# Patient Record
Sex: Male | Born: 1935 | Race: White | Hispanic: No | Marital: Married | Smoking: Never smoker
Health system: Southern US, Community
[De-identification: ages and names within clinical notes are randomized; demographics above are authoritative.]

## PROBLEM LIST (undated history)

## (undated) DIAGNOSIS — I2510 Atherosclerotic heart disease of native coronary artery without angina pectoris: Secondary | ICD-10-CM

## (undated) DIAGNOSIS — I10 Essential (primary) hypertension: Secondary | ICD-10-CM

## (undated) DIAGNOSIS — M1990 Unspecified osteoarthritis, unspecified site: Secondary | ICD-10-CM

## (undated) DIAGNOSIS — E119 Type 2 diabetes mellitus without complications: Secondary | ICD-10-CM

## (undated) HISTORY — PX: ANKLE SURGERY: SHX546

## (undated) HISTORY — PX: TONSILLECTOMY: SUR1361

## (undated) HISTORY — PX: CHOLECYSTECTOMY: SHX55

## (undated) HISTORY — PX: APPENDECTOMY: SHX54

## (undated) HISTORY — PX: HIP SURGERY: SHX245

## (undated) HISTORY — PX: PACEMAKER IMPLANT: EP1218

## (undated) HISTORY — PX: FEMUR SURGERY: SHX943

---

## 2017-09-06 ENCOUNTER — Emergency Department (HOSPITAL_BASED_OUTPATIENT_CLINIC_OR_DEPARTMENT_OTHER)
Admission: EM | Admit: 2017-09-06 | Discharge: 2017-09-06 | Disposition: A | Discharge status: Home / Self Care | Payer: Medicare Other | Attending: Physician Assistant | Admitting: Physician Assistant

## 2017-09-06 ENCOUNTER — Emergency Department (HOSPITAL_BASED_OUTPATIENT_CLINIC_OR_DEPARTMENT_OTHER): Payer: Medicare Other

## 2017-09-06 ENCOUNTER — Other Ambulatory Visit: Payer: Self-pay

## 2017-09-06 ENCOUNTER — Encounter (HOSPITAL_BASED_OUTPATIENT_CLINIC_OR_DEPARTMENT_OTHER): Payer: Self-pay | Admitting: *Deleted

## 2017-09-06 DIAGNOSIS — W06XXXA Fall from bed, initial encounter: Secondary | ICD-10-CM | POA: Diagnosis not present

## 2017-09-06 DIAGNOSIS — S0990XA Unspecified injury of head, initial encounter: Secondary | ICD-10-CM

## 2017-09-06 DIAGNOSIS — I10 Essential (primary) hypertension: Secondary | ICD-10-CM | POA: Diagnosis not present

## 2017-09-06 DIAGNOSIS — Y939 Activity, unspecified: Secondary | ICD-10-CM | POA: Insufficient documentation

## 2017-09-06 DIAGNOSIS — E119 Type 2 diabetes mellitus without complications: Secondary | ICD-10-CM | POA: Insufficient documentation

## 2017-09-06 DIAGNOSIS — Y999 Unspecified external cause status: Secondary | ICD-10-CM | POA: Insufficient documentation

## 2017-09-06 DIAGNOSIS — Y929 Unspecified place or not applicable: Secondary | ICD-10-CM | POA: Diagnosis not present

## 2017-09-06 DIAGNOSIS — S098XXA Other specified injuries of head, initial encounter: Secondary | ICD-10-CM | POA: Insufficient documentation

## 2017-09-06 DIAGNOSIS — Z950 Presence of cardiac pacemaker: Secondary | ICD-10-CM | POA: Insufficient documentation

## 2017-09-06 DIAGNOSIS — I2510 Atherosclerotic heart disease of native coronary artery without angina pectoris: Secondary | ICD-10-CM | POA: Diagnosis not present

## 2017-09-06 HISTORY — DX: Essential (primary) hypertension: I10

## 2017-09-06 HISTORY — DX: Type 2 diabetes mellitus without complications: E11.9

## 2017-09-06 HISTORY — DX: Atherosclerotic heart disease of native coronary artery without angina pectoris: I25.10

## 2017-09-06 HISTORY — DX: Unspecified osteoarthritis, unspecified site: M19.90

## 2017-09-06 NOTE — Discharge Instructions (Addendum)
Please return with any concerns.  We are happy to report that your CAT scan is normal.  No evidence of bleeding intracranially.

## 2017-09-06 NOTE — ED Provider Notes (Signed)
[facility] EMERGENCY DEPARTMENT Provider Note   CSN: [id number] Arrival date & time: 09/06/17  1939     History   Chief Complaint Chief Complaint  Patient presents with  . Fall    HPI Frank Ibarra is a 84 y.o. male.  HPI  84 year old male on blood thinners presenting with mechanical fall down on his head.  Patient reports that he fell out of the bed and hit his head.  Patient's daughter accompanies patient and reports that he had one other fall last week as well.  Patient has no altered mental status no complaints currently.  No pain anywhere.  Past Medical History:  Diagnosis Date  . Arthritis   . Coronary artery disease   . Diabetes mellitus without complication (HCC)   . Hypertension     There are no active problems to display for this patient.   Past Surgical History:  Procedure Laterality Date  . ANKLE SURGERY    . APPENDECTOMY    . CHOLECYSTECTOMY    . FEMUR SURGERY    . HIP SURGERY    . PACEMAKER IMPLANT    . TONSILLECTOMY          Home Medications    Prior to Admission medications   Not on File    Family History No family history on file.  Social History Social History   Tobacco Use  . Smoking status: Never Smoker  . Smokeless tobacco: Never Used  Substance Use Topics  . Alcohol use: Never    Frequency: Never  . Drug use: Never     Allergies   Patient has no known allergies.   Review of Systems Review of Systems  Constitutional: Negative for activity change.  Respiratory: Negative for shortness of breath.   Cardiovascular: Negative for chest pain.  Gastrointestinal: Negative for abdominal pain.  All other systems reviewed and are negative.    Physical Exam Updated Vital Signs BP 130/80   Pulse 88   Temp 99 F (37.2 C) (Oral)   Resp 20   Ht 6' 2.5" (1.892 m)   Wt 78 kg (172 lb)   SpO2 98%   BMI 21.79 kg/m   Physical Exam  Constitutional: He is oriented to person, place, and time. He appears  well-nourished.  HENT:  Head: Normocephalic.  Eyes: Conjunctivae are normal.  Cardiovascular: Normal rate and regular rhythm.  Pulmonary/Chest: Effort normal and breath sounds normal.  Abdominal: Soft. He exhibits no distension. There is no tenderness.  Musculoskeletal:  Small old bruising to right forearm, left cheek.  Moving all extremities.  Neurological: He is oriented to person, place, and time.  Skin: Skin is warm and dry. He is not diaphoretic.  Psychiatric: He has a normal mood and affect. His behavior is normal.     ED Treatments / Results  Labs (all labs ordered are listed, but only abnormal results are displayed) Labs Reviewed - No data to display  EKG None  Radiology Ct Head Wo Contrast  Result Date: 09/06/2017 CLINICAL DATA:  84 year old male sitting in chair and fell hitting left side of head on wooden table. No loss of consciousness. Initial encounter. EXAM: CT HEAD WITHOUT CONTRAST TECHNIQUE: Contiguous axial images were obtained from the base of the skull through the vertex without intravenous contrast. COMPARISON:  None. FINDINGS: Brain: No intracranial hemorrhage or CT evidence of large acute infarct. Chronic microvascular changes. Global atrophy. No intracranial mass lesion noted on this unenhanced exam. Vascular: Vascular calcifications Skull: No skull fracture Sinuses/Orbits:  No acute orbital abnormality. Mild mucosal thickening ethmoid sinus air cells greater on the right. Other: Mastoid air cells and middle ear cavities are clear. IMPRESSION: No skull fracture or intracranial hemorrhage. Chronic microvascular changes. Global atrophy. Electronically Signed   By: Lacy DuverneySteven  Olson M.D.   On: 09/06/2017 20:31    Procedures Procedures (including critical care time)  Medications Ordered in ED Medications - No data to display   Initial Impression / Assessment and Plan / ED Course  I have reviewed the triage vital signs and the nursing notes.  Pertinent labs &  imaging results that were available during my care of the patient were reviewed by me and considered in my medical decision making (see chart for details).    84 year old male on blood thinners presenting with mechanical fall down on his head.  Patient reports that he fell out of the bed and hit his head.  Patient's daughter accompanies patient and reports that he had one other fall last week as well.  Patient has no altered mental status no complaints currently.  No pain anywhere.  8:56 PM Did CT of her head.  Offered further work-up including labs and urine but patient's family is happy to have him return home.  No abrasions requiring tetanus   Final Clinical Impressions(s) / ED Diagnoses   Final diagnoses:  None    ED Discharge Orders    None       Abelino DerrickMackuen, Ryder Man Lyn, MD 09/06/17 2057

## 2017-09-06 NOTE — ED Triage Notes (Signed)
He was sitting in a chair and while reaching to get something from a table the chair tipped over. He hit the left side of his head on the wooden table. No LOC. He does take blood thinners. He has a small hematoma to the back of his head.

## 2017-09-06 NOTE — ED Notes (Signed)
Pt. Is alert and able to speak full slow sentences and per his daughter who he lives with is WNL.

## 2017-09-20 ENCOUNTER — Encounter (HOSPITAL_BASED_OUTPATIENT_CLINIC_OR_DEPARTMENT_OTHER): Payer: Self-pay | Admitting: *Deleted

## 2017-09-20 ENCOUNTER — Emergency Department (HOSPITAL_BASED_OUTPATIENT_CLINIC_OR_DEPARTMENT_OTHER): Payer: Medicare Other

## 2017-09-20 ENCOUNTER — Emergency Department (HOSPITAL_BASED_OUTPATIENT_CLINIC_OR_DEPARTMENT_OTHER)
Admission: EM | Admit: 2017-09-20 | Discharge: 2017-09-20 | Disposition: A | Discharge status: Home / Self Care | Payer: Medicare Other | Attending: Emergency Medicine | Admitting: Emergency Medicine

## 2017-09-20 ENCOUNTER — Other Ambulatory Visit: Payer: Self-pay

## 2017-09-20 DIAGNOSIS — S0083XA Contusion of other part of head, initial encounter: Secondary | ICD-10-CM | POA: Insufficient documentation

## 2017-09-20 DIAGNOSIS — Z950 Presence of cardiac pacemaker: Secondary | ICD-10-CM | POA: Diagnosis not present

## 2017-09-20 DIAGNOSIS — W01190A Fall on same level from slipping, tripping and stumbling with subsequent striking against furniture, initial encounter: Secondary | ICD-10-CM | POA: Diagnosis not present

## 2017-09-20 DIAGNOSIS — I10 Essential (primary) hypertension: Secondary | ICD-10-CM | POA: Insufficient documentation

## 2017-09-20 DIAGNOSIS — Z7901 Long term (current) use of anticoagulants: Secondary | ICD-10-CM | POA: Diagnosis not present

## 2017-09-20 DIAGNOSIS — Z79899 Other long term (current) drug therapy: Secondary | ICD-10-CM | POA: Diagnosis not present

## 2017-09-20 DIAGNOSIS — I2510 Atherosclerotic heart disease of native coronary artery without angina pectoris: Secondary | ICD-10-CM | POA: Insufficient documentation

## 2017-09-20 DIAGNOSIS — T148XXA Other injury of unspecified body region, initial encounter: Secondary | ICD-10-CM

## 2017-09-20 DIAGNOSIS — M25562 Pain in left knee: Secondary | ICD-10-CM

## 2017-09-20 DIAGNOSIS — S0003XA Contusion of scalp, initial encounter: Secondary | ICD-10-CM

## 2017-09-20 DIAGNOSIS — Y9301 Activity, walking, marching and hiking: Secondary | ICD-10-CM | POA: Insufficient documentation

## 2017-09-20 DIAGNOSIS — Y998 Other external cause status: Secondary | ICD-10-CM | POA: Diagnosis not present

## 2017-09-20 DIAGNOSIS — Y92009 Unspecified place in unspecified non-institutional (private) residence as the place of occurrence of the external cause: Secondary | ICD-10-CM | POA: Diagnosis not present

## 2017-09-20 DIAGNOSIS — W19XXXA Unspecified fall, initial encounter: Secondary | ICD-10-CM

## 2017-09-20 DIAGNOSIS — S0990XA Unspecified injury of head, initial encounter: Secondary | ICD-10-CM | POA: Diagnosis present

## 2017-09-20 DIAGNOSIS — E119 Type 2 diabetes mellitus without complications: Secondary | ICD-10-CM | POA: Insufficient documentation

## 2017-09-20 NOTE — Discharge Instructions (Addendum)
Keep the wound clean and dry for the first 24 hours. After that you may gently clean the wound with soap and water. Make sure to pat dry the wound before covering it with any dressing. You can use topical antibiotic ointment and bandage. Ice and elevate for pain relief.   Follow the RICE (Rest, Ice, Compression, Elevation) protocol as directed.  Elevate the knee while at home.  He can apply ice to help with soft tissue swelling.  You can take 1000 mg of Tylenol.  Do not exceed 4000 mg of Tylenol a day.  As we discussed, your x-ray showed that 1 of the screws was broken in the leg.  Please follow-up with your orthopedic doctor regarding that finding.  Return the emergency department for any chest pain, difficulty breathing, numbness/weakness of your arms or legs, inability to walk or bear weight on the leg or any other worsening or concerning symptoms.

## 2017-09-20 NOTE — ED Notes (Signed)
Paged Ortho via [facility] line

## 2017-09-20 NOTE — ED Triage Notes (Signed)
Fall. He tripped over his walker. Left knee injury without skin tear. Skin tear to his left forearm. He hit the back of his head on a dresser. No LOC without hematoma.

## 2017-09-20 NOTE — ED Notes (Signed)
Patient transported to X-ray 

## 2017-09-20 NOTE — ED Provider Notes (Signed)
[facility] EMERGENCY DEPARTMENT Provider Note   CSN: [id number] Arrival date & time: 09/20/17  1458     History   Chief Complaint Chief Complaint  Patient presents with  . Fall    HPI Frank Ibarra is a 84 y.o. male with PMH/o CAD, HTN, DM who presents for evaluation after a fall that began last night.  Patient reports that he was walking with his walker last night when he states his foot got caught up on the wheel, causing him to fall.  He is unsure of how he fell he states "I think I fell both forward and backward."  He states he hit the back of his head on a dresser.  He states he did not have any LOC.  This fall was unwitnessed.  Patient reports he has been able to ambulate with the assistance of his walker since then.  Comes to the emergency department today because he is committing of some posterior head pain from where he hit the corner of the dresser.  Additionally, he is complaining of some left knee pain and swelling.  Patient states he thinks he hit his left knee.  Patient also reports that he scraped his left arm.  Reports a previous skin tear from a fall last week.  She is currently on Xarelto.  Patient reports that since the incident, he is not having vision changes, vomiting.  Patient denies any preceding chest pain, dizziness prior to fall.  Patient denies any vision change, vomiting, chest pain, difficulty breathing, abdominal pain, numbness/weakness of his arms or legs, neck pain.   The history is provided by the patient and a relative.    Past Medical History:  Diagnosis Date  . Arthritis   . Coronary artery disease   . Diabetes mellitus without complication (HCC)   . Hypertension     There are no active problems to display for this patient.   Past Surgical History:  Procedure Laterality Date  . ANKLE SURGERY    . APPENDECTOMY    . CHOLECYSTECTOMY    . FEMUR SURGERY    . HIP SURGERY    . PACEMAKER IMPLANT    . TONSILLECTOMY          Home  Medications    Prior to Admission medications   Medication Sig Start Date End Date Taking? Authorizing Provider  cholecalciferol (VITAMIN D) 1000 units tablet Take 1,000 Units by mouth daily.    [provider]  escitalopram (LEXAPRO) 20 MG tablet Take 20 mg by mouth daily.    [provider]  gabapentin (NEURONTIN) 100 MG capsule Take 100 mg by mouth 3 (three) times daily.    [provider]  pantoprazole (PROTONIX) 40 MG tablet Take 40 mg by mouth daily.    [provider]  polyethylene glycol (MIRALAX / GLYCOLAX) packet Take 17 g by mouth daily.    [provider]  pravastatin (PRAVACHOL) 40 MG tablet Take 40 mg by mouth daily.    [provider]  rivaroxaban (XARELTO) 20 MG TABS tablet Take 20 mg by mouth daily with supper.    [provider]  tamsulosin (FLOMAX) 0.4 MG CAPS capsule Take 0.4 mg by mouth.    [provider]  vitamin B-12 (CYANOCOBALAMIN) 1000 MCG tablet Take 5,000 mcg by mouth daily.    [provider]    Family History No family history on file.  Social History Social History   Tobacco Use  . Smoking status: Never Smoker  .  Smokeless tobacco: Never Used  Substance Use Topics  . Alcohol use: Never    Frequency: Never  . Drug use: Never     Allergies   Patient has no known allergies.   Review of Systems Review of Systems  Eyes: Negative for visual disturbance.  Respiratory: Negative for shortness of breath.   Cardiovascular: Negative for chest pain.  Gastrointestinal: Negative for abdominal pain, nausea and vomiting.  Musculoskeletal: Negative for back pain and neck pain.       Left knee pain  Skin: Positive for wound.  Neurological: Positive for headaches. Negative for weakness and numbness.  All other systems reviewed and are negative.    Physical Exam Updated Vital Signs BP 124/64 (BP Location: Right Arm)   Pulse 84   Resp 20   Ht 6\' 2"  (1.88 m)   Wt 78 kg (172  lb)   SpO2 100%   BMI 22.08 kg/m   Physical Exam  Constitutional: He is oriented to person, place, and time. He appears well-developed and well-nourished.  HENT:  Head: Normocephalic and atraumatic.  Mouth/Throat: Oropharynx is clear and moist and mucous membranes are normal.  Tenderness palpation noted to posterior aspect of left head.  No skull deformity or crepitus noted.  No open wound.  Eyes: Pupils are equal, round, and reactive to light. Conjunctivae, EOM and lids are normal.  Neck: Full passive range of motion without pain.  Full flexion/extension and lateral movement of neck fully intact. No bony midline tenderness. No deformities or crepitus.   Cardiovascular: Normal rate, regular rhythm, normal heart sounds and normal pulses. Exam reveals no gallop and no friction rub.  No murmur heard. Pulmonary/Chest: Effort normal and breath sounds normal.  Lungs clear to auscultation bilaterally.  Symmetric chest rise.  No wheezing, rales, rhonchi.  Abdominal: Soft. Normal appearance. There is no tenderness. There is no rigidity and no guarding.  Musculoskeletal: Normal range of motion.       Thoracic back: He exhibits no tenderness.       Back:  No midline T spine tenderness.  Diffuse lumbar tenderness noted.  No deformity or crepitus noted.  Tenderness palpation noted to the anterior aspect of the left knee with some overlying soft tissue swelling.  Flexion/extension intact.  Negative anterior posterior drawer test.  No instability noticed on varus or valgus stress.  No tenderness palpation to proximal tib-fib, distal tib-fib, ankle.  Right knee, right lower extremity.  Neurological: He is alert and oriented to person, place, and time.  Cranial nerves III-XII intact Follows commands, Moves all extremities  5/5 strength to BUE and BLE  Sensation intact throughout all major nerve distributions No slurred speech. No facial droop.   Skin: Skin is warm and dry. Capillary refill takes less  than 2 seconds.  14 cm linear superficial skin abrasion noted to the posterior surface of the left forearm.  No evidence of laceration or open wound.  Superficial abrasion noted to anterior aspect of left knee.  Psychiatric: He has a normal mood and affect. His speech is normal.  Nursing note and vitals reviewed.    ED Treatments / Results  Labs (all labs ordered are listed, but only abnormal results are displayed) Labs Reviewed - No data to display  EKG None  Radiology Dg Lumbar Spine Complete  Result Date: 09/20/2017 CLINICAL DATA:  Low back pain. Status post fall today. Initial encounter. EXAM: LUMBAR SPINE - COMPLETE 4+ VIEW COMPARISON:  None. FINDINGS: Marked convex right scoliosis with the apex  at L2 is identified. No fracture is seen. Severe multilevel degenerative disc disease is seen and there is extensive facet arthropathy in the mid and lower lumbar spine. Aortic atherosclerosis is noted. Right hip replacement and fixation of a left hip fracture are partially visualized. Feeding tube is in place. IMPRESSION: No acute abnormality. Convex right scoliosis and severe multilevel degenerative disease. Electronically Signed   By: Drusilla Kanner M.D.   On: 09/20/2017 16:14   Ct Head Wo Contrast  Result Date: 09/20/2017 CLINICAL DATA:  Status post fall.  No loss consciousness. EXAM: CT HEAD WITHOUT CONTRAST CT CERVICAL SPINE WITHOUT CONTRAST TECHNIQUE: Multidetector CT imaging of the head and cervical spine was performed following the standard protocol without intravenous contrast. Multiplanar CT image reconstructions of the cervical spine were also generated. COMPARISON:  None. FINDINGS: CT HEAD FINDINGS Brain: No evidence of acute infarction, hemorrhage, extra-axial collection, ventriculomegaly, or mass effect. Generalized cerebral atrophy. Periventricular white matter low attenuation likely secondary to microangiopathy. Vascular: Cerebrovascular atherosclerotic calcifications are noted.  Skull: Negative for fracture or focal lesion. Sinuses/Orbits: Visualized portions of the orbits are unremarkable. Visualized portions of the paranasal sinuses are unremarkable. Visualized portions of the mastoid air cells are unremarkable. Other: None. CT CERVICAL SPINE FINDINGS Alignment: Normal. Skull base and vertebrae: No acute fracture. No primary bone lesion or focal pathologic process. Soft tissues and spinal canal: No prevertebral fluid or swelling. No visible canal hematoma. Disc levels: Degenerative disc disease with disc height loss at C3-4, C5-6 and to lesser extent C6-7. Bilateral uncovertebral degenerative changes at C3-4 with right foraminal narrowing. Mild broad-based disc bulge with mild left foraminal stenosis at C4-5. Upper chest: Lung apices are clear. Other: No fluid collection or hematoma. 15 mm hypodense left thyroid nodule with peripheral calcification. 16 x 13 mm hypodense right thyroid nodule. IMPRESSION: 1. No acute intracranial pathology. 2. No acute osseous injury of the cervical spine. 3. Cervical spine spondylosis as described above. Electronically Signed   By: Elige Ko   On: 09/20/2017 16:05   Ct Cervical Spine Wo Contrast  Result Date: 09/20/2017 CLINICAL DATA:  Status post fall.  No loss consciousness. EXAM: CT HEAD WITHOUT CONTRAST CT CERVICAL SPINE WITHOUT CONTRAST TECHNIQUE: Multidetector CT imaging of the head and cervical spine was performed following the standard protocol without intravenous contrast. Multiplanar CT image reconstructions of the cervical spine were also generated. COMPARISON:  None. FINDINGS: CT HEAD FINDINGS Brain: No evidence of acute infarction, hemorrhage, extra-axial collection, ventriculomegaly, or mass effect. Generalized cerebral atrophy. Periventricular white matter low attenuation likely secondary to microangiopathy. Vascular: Cerebrovascular atherosclerotic calcifications are noted. Skull: Negative for fracture or focal lesion. Sinuses/Orbits:  Visualized portions of the orbits are unremarkable. Visualized portions of the paranasal sinuses are unremarkable. Visualized portions of the mastoid air cells are unremarkable. Other: None. CT CERVICAL SPINE FINDINGS Alignment: Normal. Skull base and vertebrae: No acute fracture. No primary bone lesion or focal pathologic process. Soft tissues and spinal canal: No prevertebral fluid or swelling. No visible canal hematoma. Disc levels: Degenerative disc disease with disc height loss at C3-4, C5-6 and to lesser extent C6-7. Bilateral uncovertebral degenerative changes at C3-4 with right foraminal narrowing. Mild broad-based disc bulge with mild left foraminal stenosis at C4-5. Upper chest: Lung apices are clear. Other: No fluid collection or hematoma. 15 mm hypodense left thyroid nodule with peripheral calcification. 16 x 13 mm hypodense right thyroid nodule. IMPRESSION: 1. No acute intracranial pathology. 2. No acute osseous injury of the cervical spine. 3. Cervical spine  spondylosis as described above. Electronically Signed   By: Elige Ko   On: 09/20/2017 16:05   Dg Knee Complete 4 Views Left  Result Date: 09/20/2017 CLINICAL DATA:  Fall, left knee injury EXAM: LEFT KNEE - COMPLETE 4+ VIEW COMPARISON:  None. FINDINGS: Status post IM nail with distal interlocking screw fixation of the femur, incompletely visualized. One of the distal interlocking screws has fractured, acuity indeterminate. Mild degenerative changes of the knee in the patellofemoral compartment. Associated small suprapatellar knee joint effusion. Deformity related to prior proximal fibular fracture, chronic. IMPRESSION: Status post ORIF of the femur, incompletely visualized. Fracture of 1 of the 2 distal interlocking screws, acuity indeterminate. Mild degenerative changes of the knee in the patellofemoral compartment. Associated small suprapatellar knee joint effusion. Deformity loaded to prior proximal fibular fracture, chronic.  Electronically Signed   By: Charline Bills M.D.   On: 09/20/2017 16:14    Procedures Procedures (including critical care time)  Medications Ordered in ED Medications - No data to display   Initial Impression / Assessment and Plan / ED Course  I have reviewed the triage vital signs and the nursing notes.  Pertinent labs & imaging results that were available during my care of the patient were reviewed by me and considered in my medical decision making (see chart for details).     84 year old male who presents for evaluation after a fall that occurred last night.  Reports he tripped over his walker.  No preceding chest pain or dizziness.  Is currently on Xarelto.  Comes in the ED complain of severe left head pain, left knee pain.  Also has skin tears noted to the arm and knee. Patient is afebrile, non-toxic appearing, sitting comfortably on examination table. Vital signs reviewed and stable.  On exam, patient has tenderness to palpation noted to the posterior aspect of left head.  No deformity or crepitus noted.  No wound.  He has a superficial skin tear noted to the left forearm.  Area is very superficial and does not require any suturing repair in the ED.  Additionally has an abrasion noted to the knee.  Patient denies any preceding chest pain, dizziness.  His fall sounds mechanical in nature as he tripped over his walker.  Given that he is on Xarelto, will plan for CT head and CT C-spine.  Additionally, will x-ray lumbar spine given tenderness and left knee.  Plan for wound care here in the department.  Given that there is no open laceration, no indication for Tdap at this time.  CT head shows no acute intracranial abnormality.  No acute bony abnormality of cervical spine.  He does have cervical spine spondylosis noted some degenerative disc disease.  Otherwise unremarkable.  Knee x-ray shows ORIF of femur that is incompletely visualized.  There is mention of a fracture of 1 of the 2 distal  interlocking screws.  It is age-indeterminate.  Patient does have some small suprapatellar knee joint effusion but no other acute abnormalities.   Patient had his procedure done by [facility].  We will plan to consult them for any further recommendation regarding the screw fracture.  Discussed results with patient and family.  They report they think that this screw fracture has been seen previously and is not new.   Ortho paged.  Family reported they would like to leave at this time.  They showed me records from patient's previous [facility] online portal which showed that this screw fracture is not a new finding.  They would not like to wait for orthopedics to call back for further recommendation.  Encourage patient on supportive at home therapies.  Encourage use of walker to help with ambulating.  Instructed patient follow-up with his primary care doctor in the next 24 to 48 hours for further evaluation. Patient had ample opportunity for questions and discussion. All patient's questions were answered with full understanding. Strict return precautions discussed. Patient expresses understanding and agreement to plan.   Final Clinical Impressions(s) / ED Diagnoses   Final diagnoses:  Fall, initial encounter  Abrasion  Acute pain of left knee  Contusion of scalp, initial encounter    ED Discharge Orders    None       Rosana Hoes 09/20/17 1826    Raeford Razor, MD 09/20/17 2029

## 2018-03-12 ENCOUNTER — Encounter (HOSPITAL_BASED_OUTPATIENT_CLINIC_OR_DEPARTMENT_OTHER): Payer: Self-pay | Admitting: *Deleted

## 2018-03-12 ENCOUNTER — Other Ambulatory Visit: Payer: Self-pay

## 2018-03-12 ENCOUNTER — Emergency Department (HOSPITAL_BASED_OUTPATIENT_CLINIC_OR_DEPARTMENT_OTHER): Payer: Medicare Other

## 2018-03-12 ENCOUNTER — Emergency Department (HOSPITAL_BASED_OUTPATIENT_CLINIC_OR_DEPARTMENT_OTHER)
Admission: EM | Admit: 2018-03-12 | Discharge: 2018-03-12 | Disposition: A | Discharge status: Home / Self Care | Payer: Medicare Other | Attending: Emergency Medicine | Admitting: Emergency Medicine

## 2018-03-12 DIAGNOSIS — Y929 Unspecified place or not applicable: Secondary | ICD-10-CM | POA: Insufficient documentation

## 2018-03-12 DIAGNOSIS — R51 Headache: Secondary | ICD-10-CM | POA: Insufficient documentation

## 2018-03-12 DIAGNOSIS — Y939 Activity, unspecified: Secondary | ICD-10-CM | POA: Diagnosis not present

## 2018-03-12 DIAGNOSIS — I10 Essential (primary) hypertension: Secondary | ICD-10-CM | POA: Diagnosis not present

## 2018-03-12 DIAGNOSIS — Y999 Unspecified external cause status: Secondary | ICD-10-CM | POA: Insufficient documentation

## 2018-03-12 DIAGNOSIS — S0090XA Unspecified superficial injury of unspecified part of head, initial encounter: Secondary | ICD-10-CM | POA: Diagnosis present

## 2018-03-12 DIAGNOSIS — E119 Type 2 diabetes mellitus without complications: Secondary | ICD-10-CM | POA: Insufficient documentation

## 2018-03-12 DIAGNOSIS — Z79899 Other long term (current) drug therapy: Secondary | ICD-10-CM | POA: Diagnosis not present

## 2018-03-12 DIAGNOSIS — S0990XA Unspecified injury of head, initial encounter: Secondary | ICD-10-CM

## 2018-03-12 DIAGNOSIS — W0110XA Fall on same level from slipping, tripping and stumbling with subsequent striking against unspecified object, initial encounter: Secondary | ICD-10-CM | POA: Diagnosis not present

## 2018-03-12 NOTE — ED Triage Notes (Signed)
Pt reports he fell today, hit head on carpet. He is on xarelto

## 2018-03-12 NOTE — ED Provider Notes (Signed)
[facility] EMERGENCY DEPARTMENT Provider Note   CSN: [id number] Arrival date & time: 03/12/18  2037     History   Chief Complaint Chief Complaint  Patient presents with  . Head Injury    HPI Frank Ibarra is a 84 y.o. male.  Pt presents to the ED today with head trauma after a fall.  Pt has a hx of ambulatory dysfunction and normally walks with a walker.  He got up and started walking without his walker and tripped and fell.  He did hit his head.  He is on Xarelto because of a hx of afib.  He has recently been put into a PT regimen which is difficult for him and is causing him to feel tired.  He was unable to get up without help after the fall which is normal for him.  His daughter said he was able to walk to the car with his walker.  He does not feel like any of his bones are broken.     Past Medical History:  Diagnosis Date  . Arthritis   . Coronary artery disease   . Diabetes mellitus without complication (HCC)   . Hypertension     There are no active problems to display for this patient.   Past Surgical History:  Procedure Laterality Date  . ANKLE SURGERY    . APPENDECTOMY    . CHOLECYSTECTOMY    . FEMUR SURGERY    . HIP SURGERY    . PACEMAKER IMPLANT    . TONSILLECTOMY          Home Medications    Prior to Admission medications   Medication Sig Start Date End Date Taking? Authorizing Provider  cholecalciferol (VITAMIN D) 1000 units tablet Take 1,000 Units by mouth daily.    [provider]  escitalopram (LEXAPRO) 20 MG tablet Take 20 mg by mouth daily.    [provider]  gabapentin (NEURONTIN) 100 MG capsule Take 100 mg by mouth 3 (three) times daily.    [provider]  pantoprazole (PROTONIX) 40 MG tablet Take 40 mg by mouth daily.    [provider]  polyethylene glycol (MIRALAX / GLYCOLAX) packet Take 17 g by mouth daily.    [provider]  pravastatin (PRAVACHOL) 40 MG tablet Take 40 mg by  mouth daily.    [provider]  rivaroxaban (XARELTO) 20 MG TABS tablet Take 20 mg by mouth daily with supper.    [provider]  tamsulosin (FLOMAX) 0.4 MG CAPS capsule Take 0.4 mg by mouth.    [provider]  vitamin B-12 (CYANOCOBALAMIN) 1000 MCG tablet Take 5,000 mcg by mouth daily.    [provider]    Family History No family history on file.  Social History Social History   Tobacco Use  . Smoking status: Never Smoker  . Smokeless tobacco: Never Used  Substance Use Topics  . Alcohol use: Never    Frequency: Never  . Drug use: Never     Allergies   Patient has no known allergies.   Review of Systems Review of Systems  Neurological: Positive for headaches.  All other systems reviewed and are negative.    Physical Exam Updated Vital Signs BP (!) 143/77 (BP Location: Right Arm)   Pulse 82   Temp 98.5 F (36.9 C) (Oral)   Resp 16   SpO2 97%   Physical Exam Vitals signs and nursing note reviewed.  Constitutional:      Appearance:  Normal appearance.  HENT:     Head: Normocephalic.      Right Ear: External ear normal.     Left Ear: External ear normal.     Nose: Nose normal.     Mouth/Throat:     Mouth: Mucous membranes are moist.     Pharynx: Oropharynx is clear.  Eyes:     Extraocular Movements: Extraocular movements intact.     Conjunctiva/sclera: Conjunctivae normal.     Pupils: Pupils are equal, round, and reactive to light.  Neck:     Musculoskeletal: Normal range of motion and neck supple.  Cardiovascular:     Rate and Rhythm: Normal rate and regular rhythm.     Pulses: Normal pulses.     Heart sounds: Normal heart sounds.  Pulmonary:     Effort: Pulmonary effort is normal.     Breath sounds: Normal breath sounds.  Abdominal:     General: Abdomen is flat. Bowel sounds are normal.  Musculoskeletal: Normal range of motion.  Skin:    General: Skin is warm and dry.     Capillary Refill: Capillary refill  takes less than 2 seconds.  Neurological:     General: No focal deficit present.     Mental Status: He is alert and oriented to person, place, and time.  Psychiatric:        Mood and Affect: Mood normal.        Behavior: Behavior normal.      ED Treatments / Results  Labs (all labs ordered are listed, but only abnormal results are displayed) Labs Reviewed - No data to display  EKG None  Radiology Ct Head Wo Contrast  Result Date: 03/12/2018 CLINICAL DATA:  Fall. Head injury. EXAM: CT HEAD WITHOUT CONTRAST TECHNIQUE: Contiguous axial images were obtained from the base of the skull through the vertex without intravenous contrast. COMPARISON:  09/20/2017 FINDINGS: Brain: There is no evidence for acute hemorrhage, hydrocephalus, mass lesion, or abnormal extra-axial fluid collection. No definite CT evidence for acute infarction. Diffuse loss of parenchymal volume is consistent with atrophy. Patchy low attenuation in the deep hemispheric and periventricular white matter is nonspecific, but likely reflects chronic microvascular ischemic demyelination. Vascular: No hyperdense vessel or unexpected calcification. Skull: No evidence for fracture. No worrisome lytic or sclerotic lesion. Sinuses/Orbits: The visualized paranasal sinuses and mastoid air cells are clear. Visualized portions of the globes and intraorbital fat are unremarkable. Other: None. IMPRESSION: 1. No acute intracranial abnormality. 2. Atrophy with chronic small vessel white matter ischemic disease. Electronically Signed   By: Kennith CenterEric  Mansell M.D.   On: 03/12/2018 21:16    Procedures Procedures (including critical care time)  Medications Ordered in ED Medications - No data to display   Initial Impression / Assessment and Plan / ED Course  I have reviewed the triage vital signs and the nursing notes.  Pertinent labs & imaging results that were available during my care of the patient were reviewed by me and considered in my  medical decision making (see chart for details).    CT normal and pt is at his baseline.  Pt is stable for d/c.  Return if worse.  F/u with pcp.  Final Clinical Impressions(s) / ED Diagnoses   Final diagnoses:  Minor head injury, initial encounter    ED Discharge Orders    None       Jacalyn LefevreHaviland, Leasia Swann, MD 03/12/18 2135

## 2018-03-12 NOTE — ED Notes (Signed)
ED Provider at bedside. 

## 2018-08-22 DEATH — deceased

## 2019-03-24 IMAGING — CT CT CERVICAL SPINE W/O CM
4 of 7 series · 15 of 33 positions shown, 16 images · non-contrast
Comparison: None.

CLINICAL DATA: Status post fall.  No loss consciousness.

EXAM:
CT HEAD WITHOUT CONTRAST
CT CERVICAL SPINE WITHOUT CONTRAST
TECHNIQUE: Multidetector CT imaging of the head and cervical spine was
performed following the standard protocol without intravenous
contrast. Multiplanar CT image reconstructions of the cervical spine
were also generated.

[Series 4: head 3.0 mpr cor · coronal · 0.31mm/px · 3 of 69 slices shown]
[im 18/69  bone]
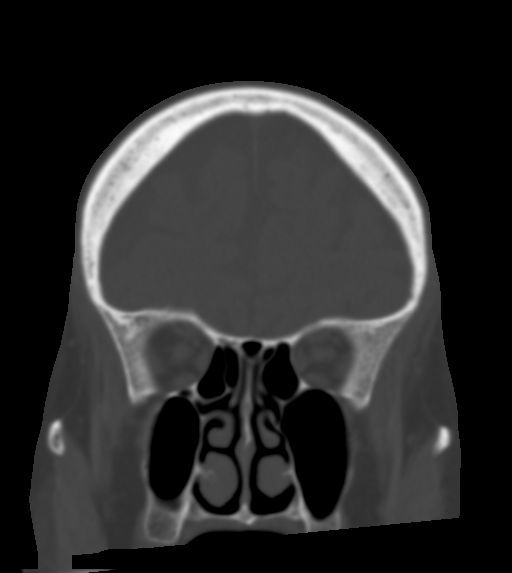
[im 35/69  bone]
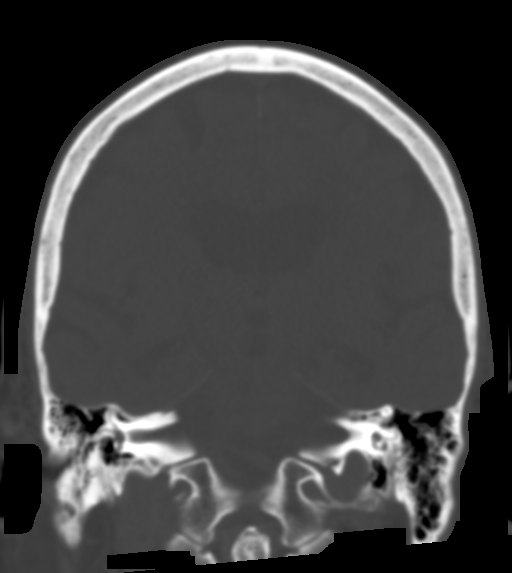
[im 52/69  bone]
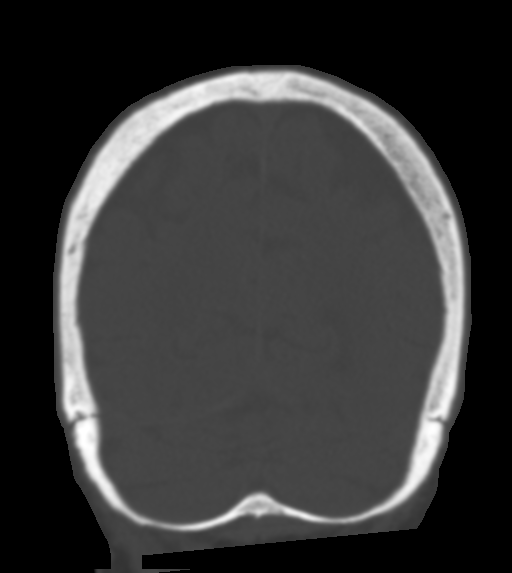

[Series 7: c_spine 2.0 i30s 3 · axial · 0.33mm/px · z∈[-318,-190]mm · 4 of 108 slices shown, 5 images]
[im 22/108  soft-tissue]
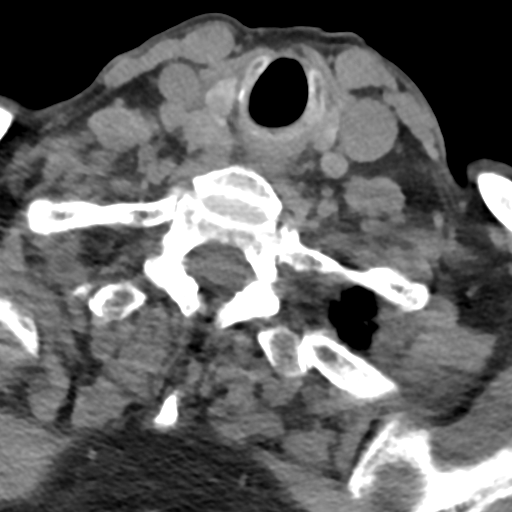
[im 22/108  bone]
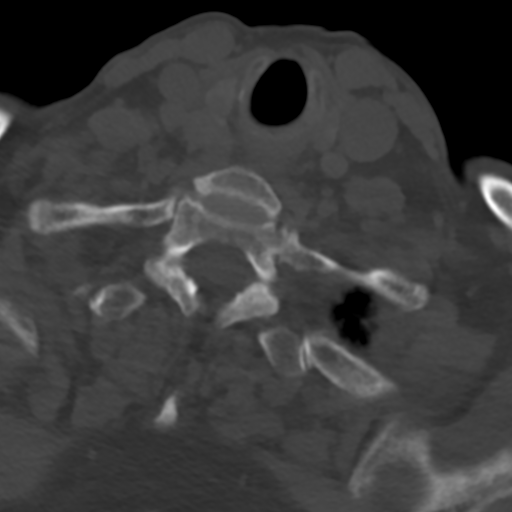
[im 43/108  bone]
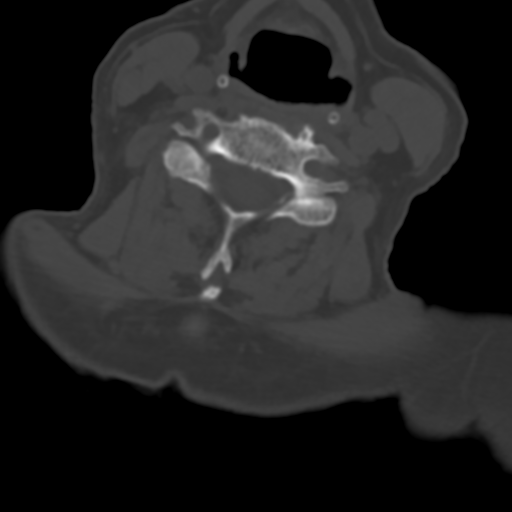
[im 65/108  bone]
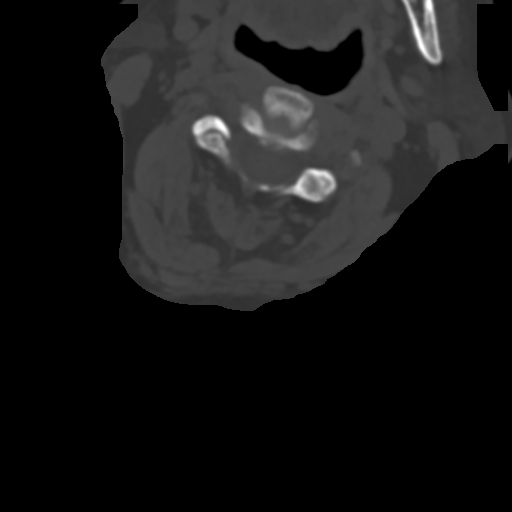
[im 86/108  bone]
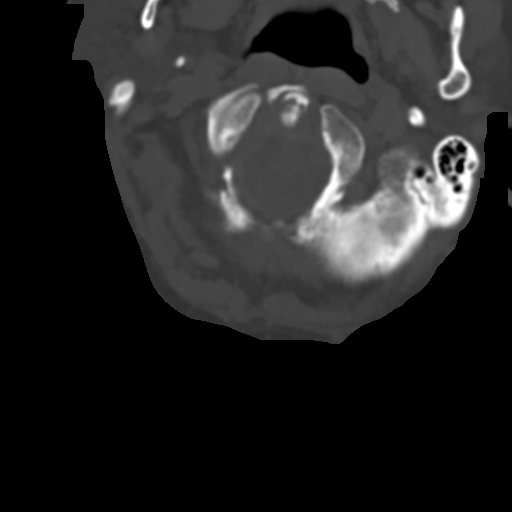

[Series 10: sagittals · sagittal · 0.35mm/px · 5 of 77 slices shown]
[im 11/77  bone]
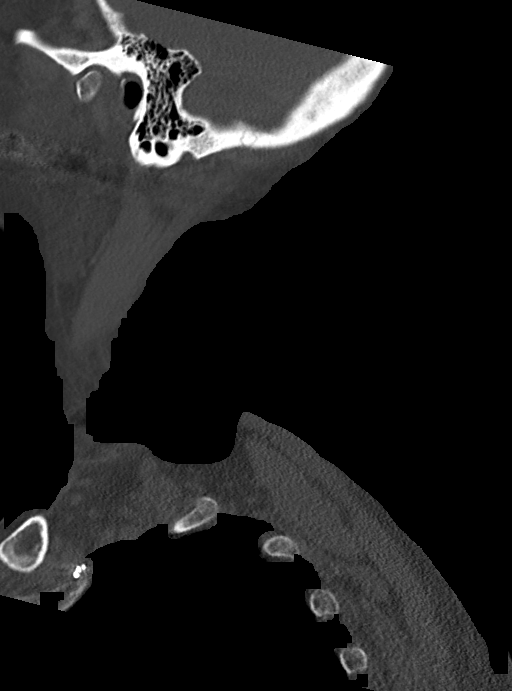
[im 22/77  bone]
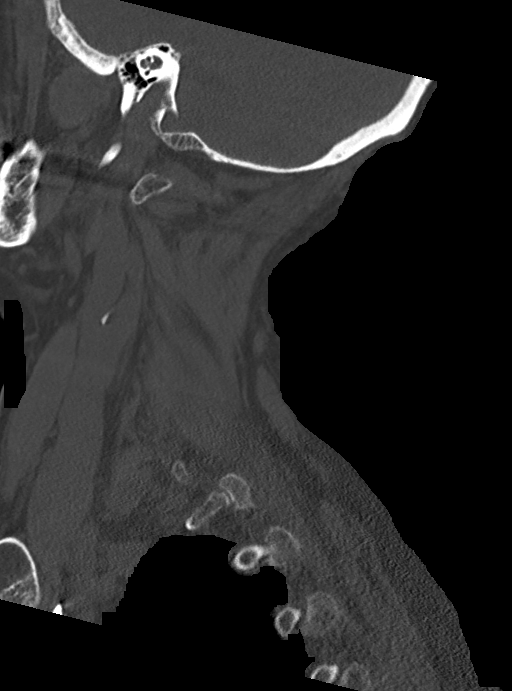
[im 33/77  bone]
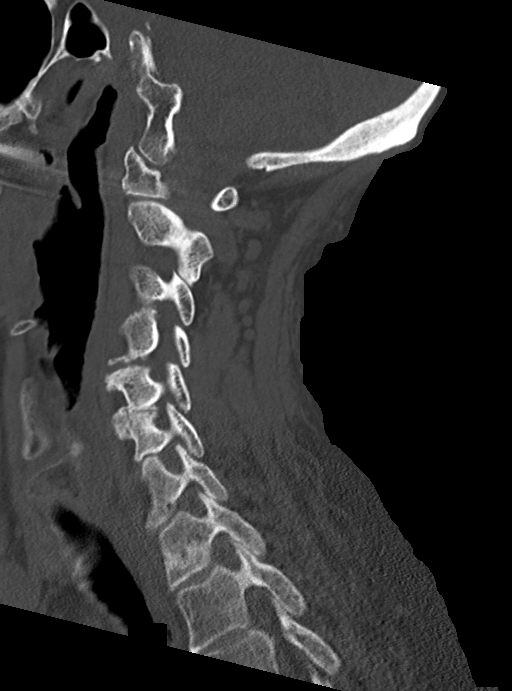
[im 44/77  bone]
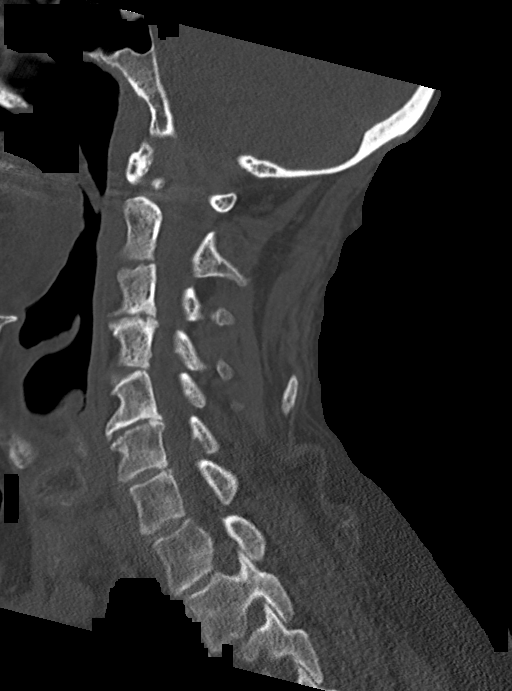
[im 55/77  bone]
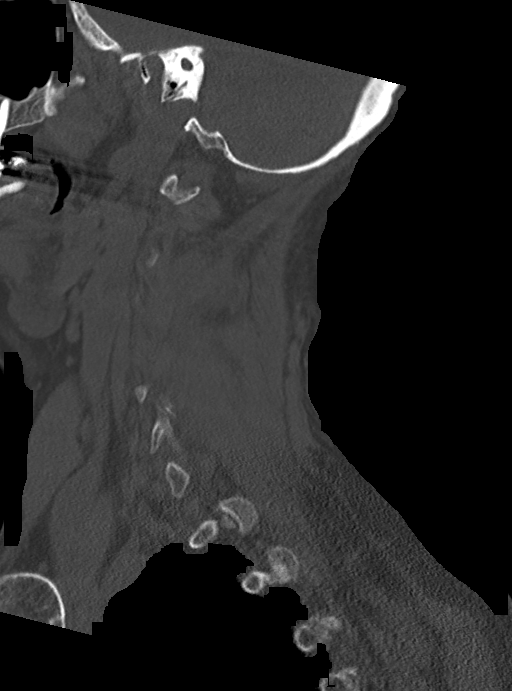

[Series 11: orthogonals · axial · 0.27mm/px · z∈[-329,-248]mm · 3 of 106 slices shown]
[im 22/106  bone]
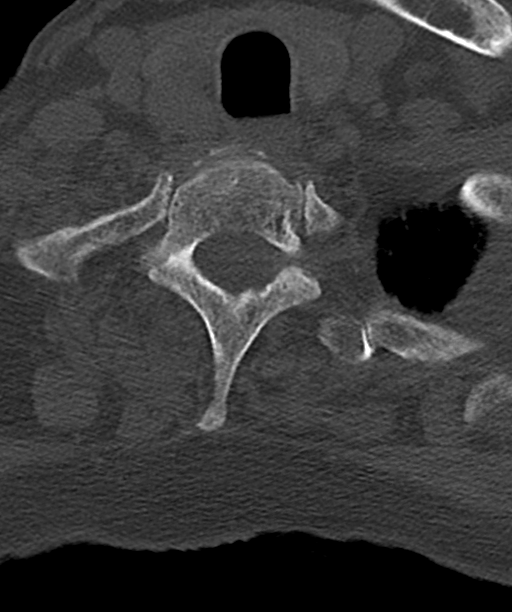
[im 43/106  bone]
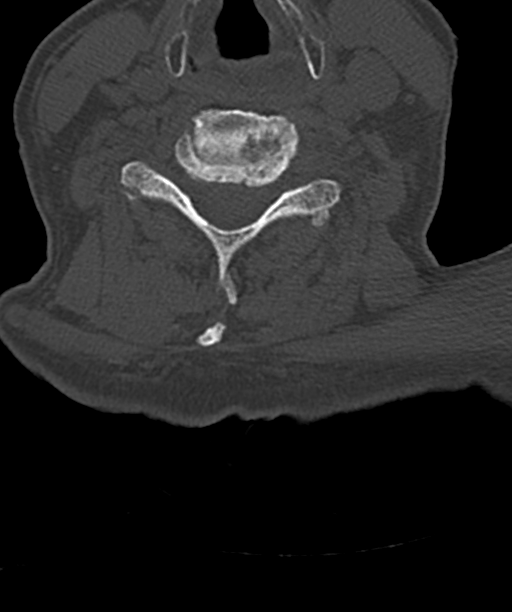
[im 64/106  bone]
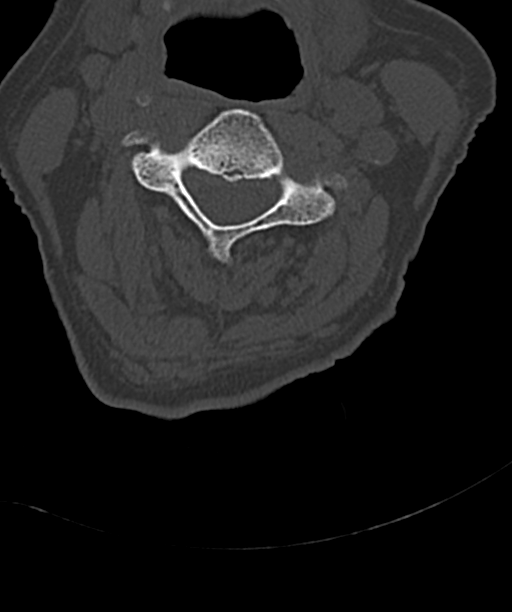

[15 of 33 positions shown; findings below may reference images not displayed]

FINDINGS: CT HEAD FINDINGS

Brain: No evidence of acute infarction, hemorrhage, extra-axial
collection, ventriculomegaly, or mass effect. Generalized cerebral
atrophy. Periventricular white matter low attenuation likely
secondary to microangiopathy.

Vascular: Cerebrovascular atherosclerotic calcifications are noted.

Skull: Negative for fracture or focal lesion.

Sinuses/Orbits: Visualized portions of the orbits are unremarkable.
Visualized portions of the paranasal sinuses are unremarkable.
Visualized portions of the mastoid air cells are unremarkable.

Other: None.

CT CERVICAL SPINE FINDINGS

Alignment: Normal.

Skull base and vertebrae: No acute fracture. No primary bone lesion
or focal pathologic process.

Soft tissues and spinal canal: No prevertebral fluid or swelling. No
visible canal hematoma.

Disc levels: Degenerative disc disease with disc height loss at
C3-4, C5-6 and to lesser extent C6-7. Bilateral uncovertebral
degenerative changes at C3-4 with right foraminal narrowing. Mild
broad-based disc bulge with mild left foraminal stenosis at C4-5.

Upper chest: Lung apices are clear.

Other: No fluid collection or hematoma. 15 mm hypodense left thyroid
nodule with peripheral calcification. 16 x 13 mm hypodense right
thyroid nodule.
IMPRESSION: 1. No acute intracranial pathology.
2. No acute osseous injury of the cervical spine.
3. Cervical spine spondylosis as described above.

## 2019-03-24 IMAGING — CR DG LUMBAR SPINE COMPLETE 4+V
5 series · 5 of 5 positions shown · non-contrast
Comparison: None.

CLINICAL DATA: Low back pain. Status post fall today. Initial
encounter.

EXAM:
LUMBAR SPINE - COMPLETE 4+ VIEW

[t l-spine a.p.]
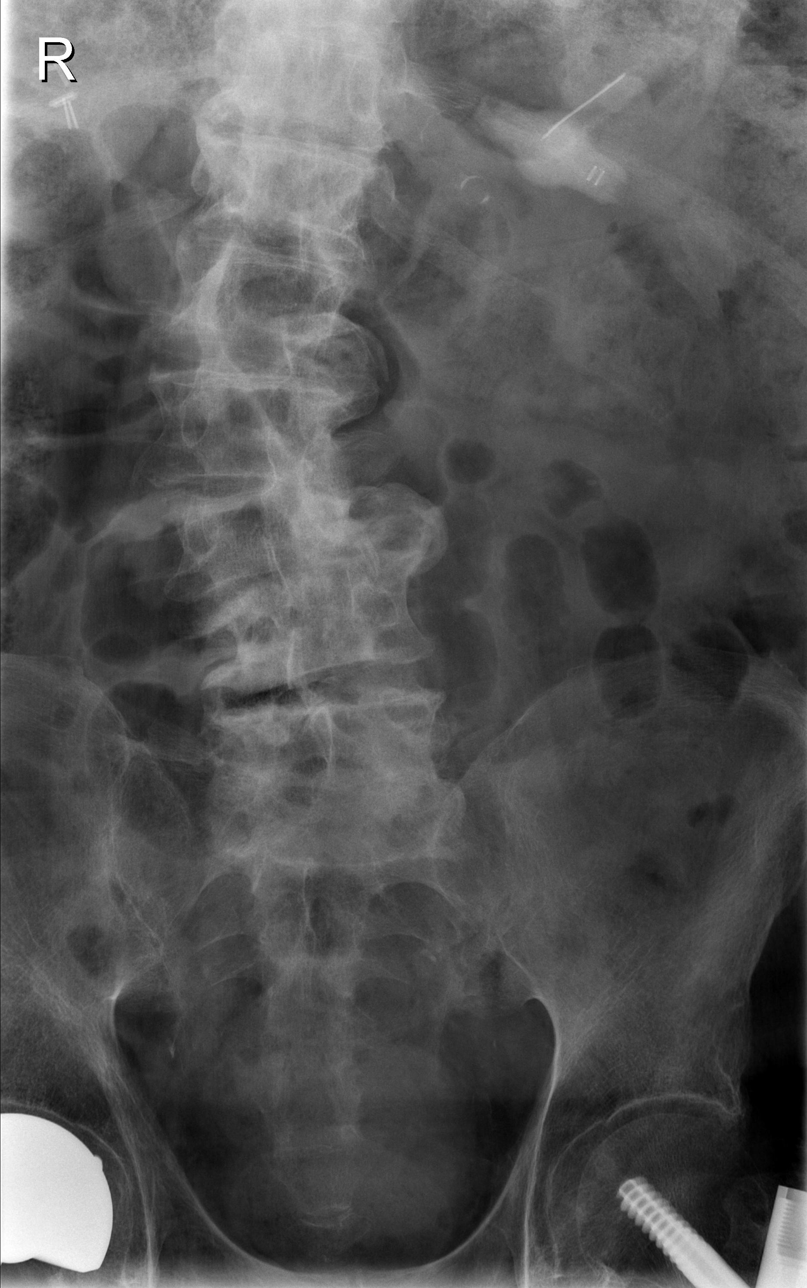

[t l-spine oblique exposure (1 of 2)]
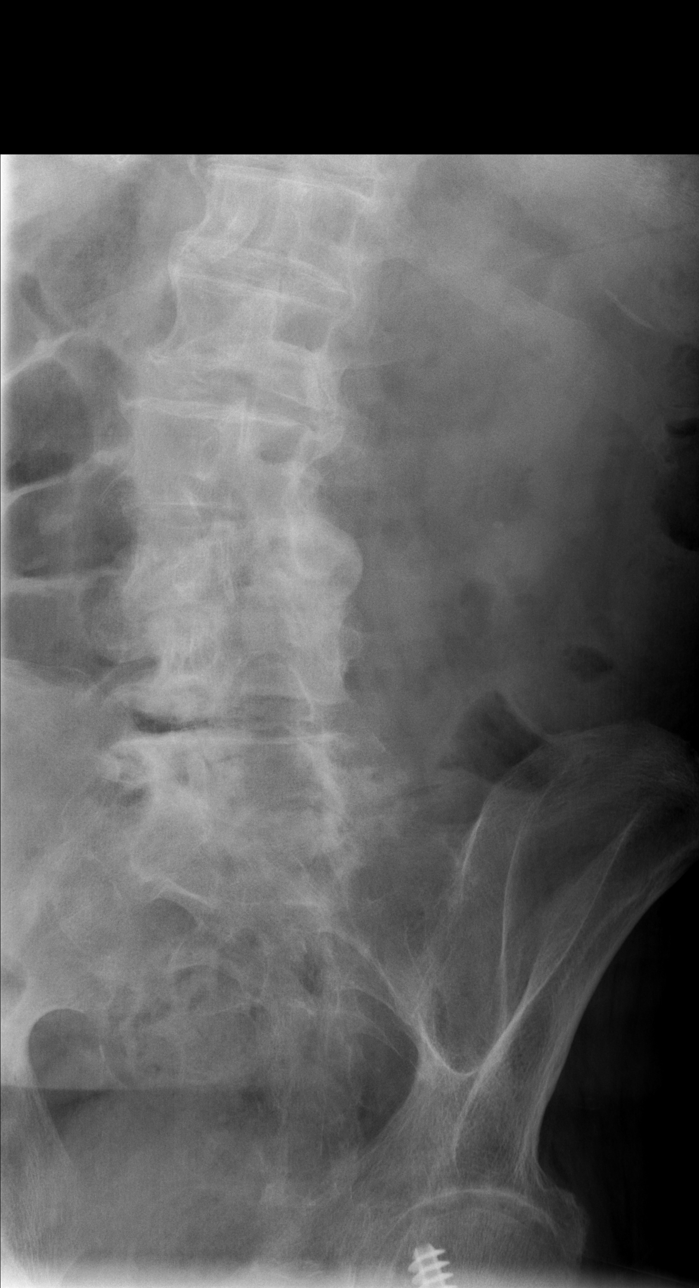

[t l-spine oblique exposure (2 of 2)]
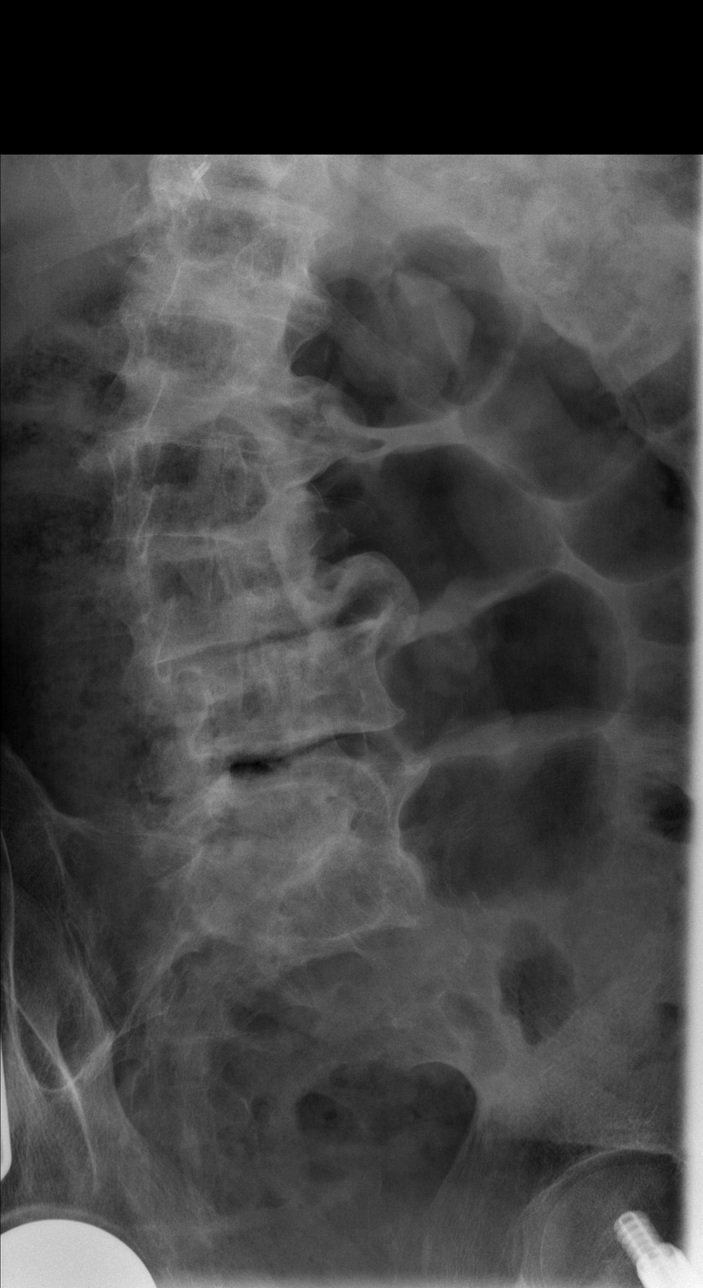

[t l-spine lat]
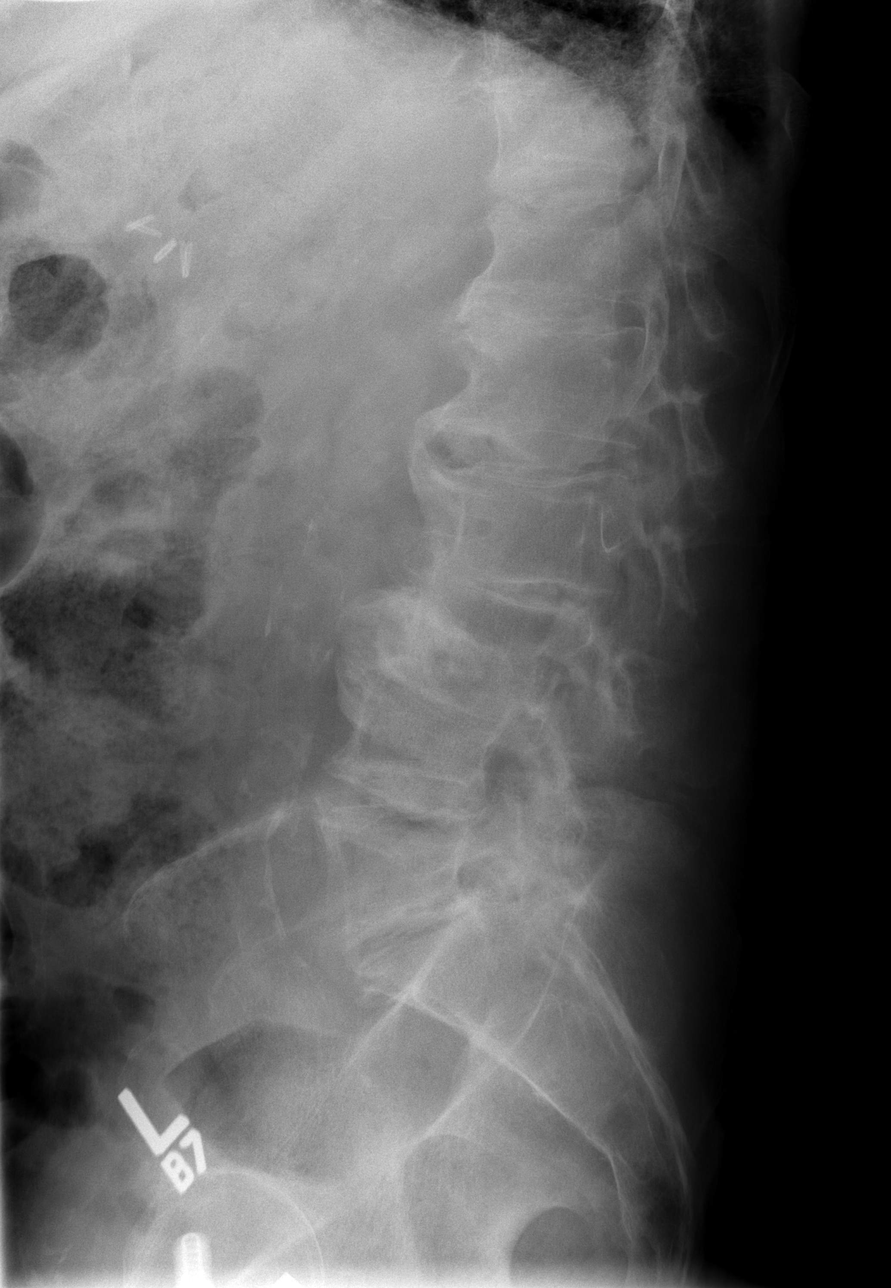

[t l-spine l5-s1 spot]
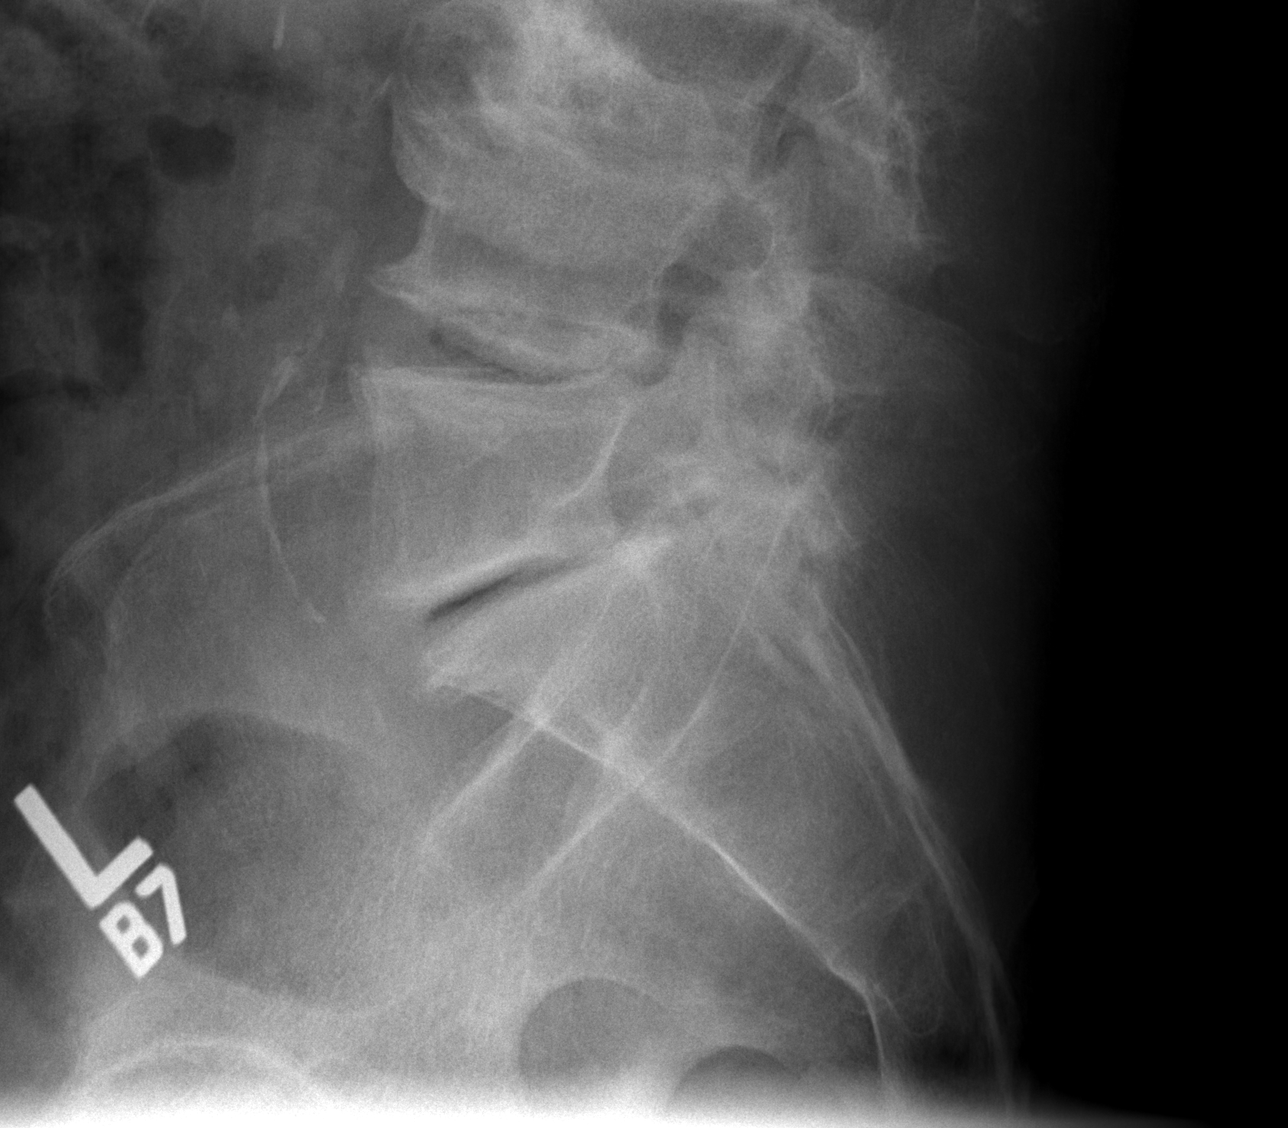

[5 of 5 positions shown; findings below may reference images not displayed]

FINDINGS: Marked convex right scoliosis with the apex at L2 is identified. No
fracture is seen. Severe multilevel degenerative disc disease is
seen and there is extensive facet arthropathy in the mid and lower
lumbar spine. Aortic atherosclerosis is noted. Right hip replacement
and fixation of a left hip fracture are partially visualized.
Feeding tube is in place.
IMPRESSION: No acute abnormality.

Convex right scoliosis and severe multilevel degenerative disease.
